# Patient Record
Sex: Male | Born: 1985 | Race: Asian | Hispanic: No | Marital: Married | Smoking: Never smoker
Health system: Southern US, Community
[De-identification: ages and names within clinical notes are randomized; demographics above are authoritative.]

---

## 2010-05-07 ENCOUNTER — Emergency Department (HOSPITAL_COMMUNITY): Admission: EM | Admit: 2010-05-07 | Discharge: 2010-05-07 | Payer: Self-pay | Admitting: Emergency Medicine

## 2011-04-13 ENCOUNTER — Inpatient Hospital Stay (INDEPENDENT_AMBULATORY_CARE_PROVIDER_SITE_OTHER)
Admission: RE | Admit: 2011-04-13 | Discharge: 2011-04-13 | Disposition: A | Discharge status: Home / Self Care | Payer: Self-pay | Source: Ambulatory Visit | Attending: Emergency Medicine | Admitting: Emergency Medicine

## 2011-04-13 DIAGNOSIS — R197 Diarrhea, unspecified: Secondary | ICD-10-CM

## 2011-04-13 DIAGNOSIS — N289 Disorder of kidney and ureter, unspecified: Secondary | ICD-10-CM

## 2011-04-13 LAB — CBC
HCT: 44.9 % (ref 39.0–52.0)
MCHC: 36.1 g/dL — ABNORMAL HIGH (ref 30.0–36.0)
MCV: 82.7 fL (ref 78.0–100.0)
Platelets: 211 10*3/uL (ref 150–400)
RDW: 12.4 % (ref 11.5–15.5)

## 2011-04-13 LAB — POCT I-STAT, CHEM 8
Calcium, Ion: 1.19 mmol/L (ref 1.12–1.32)
Chloride: 106 mEq/L (ref 96–112)
Glucose, Bld: 86 mg/dL (ref 70–99)
HCT: 50 % (ref 39.0–52.0)
TCO2: 25 mmol/L (ref 0–100)

## 2011-04-13 LAB — DIFFERENTIAL
Basophils Absolute: 0 10*3/uL (ref 0.0–0.1)
Eosinophils Absolute: 0.1 10*3/uL (ref 0.0–0.7)
Eosinophils Relative: 2 % (ref 0–5)
Lymphocytes Relative: 31 % (ref 12–46)
Monocytes Absolute: 0.5 10*3/uL (ref 0.1–1.0)

## 2011-04-13 LAB — OCCULT BLOOD, POC DEVICE: Fecal Occult Bld: NEGATIVE

## 2011-04-20 ENCOUNTER — Inpatient Hospital Stay (INDEPENDENT_AMBULATORY_CARE_PROVIDER_SITE_OTHER)
Admission: RE | Admit: 2011-04-20 | Discharge: 2011-04-20 | Disposition: A | Discharge status: Home / Self Care | Payer: Self-pay | Source: Ambulatory Visit | Attending: Family Medicine | Admitting: Family Medicine

## 2011-04-20 DIAGNOSIS — K5289 Other specified noninfective gastroenteritis and colitis: Secondary | ICD-10-CM

## 2011-04-20 LAB — POCT I-STAT, CHEM 8
Calcium, Ion: 1.16 mmol/L (ref 1.12–1.32)
Glucose, Bld: 93 mg/dL (ref 70–99)
HCT: 49 % (ref 39.0–52.0)
Hemoglobin: 16.7 g/dL (ref 13.0–17.0)
Potassium: 4.1 mEq/L (ref 3.5–5.1)

## 2013-01-17 ENCOUNTER — Emergency Department (HOSPITAL_COMMUNITY)
Admission: EM | Admit: 2013-01-17 | Discharge: 2013-01-17 | Disposition: A | Discharge status: Home / Self Care | Payer: BC Managed Care – PPO | Source: Home / Self Care | Attending: Emergency Medicine | Admitting: Emergency Medicine

## 2013-01-17 ENCOUNTER — Encounter (HOSPITAL_COMMUNITY): Payer: Self-pay | Admitting: Emergency Medicine

## 2013-01-17 DIAGNOSIS — R21 Rash and other nonspecific skin eruption: Secondary | ICD-10-CM

## 2013-01-17 MED ORDER — PREDNISONE 20 MG PO TABS: 40.0000 mg | ORAL_TABLET | Freq: Every day | ORAL | Status: AC

## 2013-01-17 NOTE — ED Notes (Signed)
Pt c/o poss allergic reaction to foods Has noticed that every time he eats beef, shrimp, fish he starts to develop a rash on arms and legs Last week he ate beef and developed a rash that he still has Denies: f/v/d; has not had any meds for discomfort  He is alert and oriented w/no signs of acute distress.

## 2013-01-17 NOTE — ED Provider Notes (Signed)
History     CSN: [id number]  Arrival date & time 01/17/13  1522   First MD Initiated Contact with Patient 01/17/13 1619      Chief Complaint  Patient presents with  . Allergic Reaction    (Consider location/radiation/quality/duration/timing/severity/associated sxs/prior treatment) HPI Comments: Patient presents urgent care this evening describing that for approximately 3 years he's been developing a rash to his lower extremities that he attributes is trigger and caused by eating certain foods such as beef and shrimp. Mainly affects his lower extremities are sometimes also on his arms. It mildly itchy his. Denies any constitutional symptoms such as fevers, generalized malaise, providers while he is a changes in appetite or fevers. He has not tried anything over-the-counter he or so far as he didn't know what to take. He is to be prescribed a medicine by an [place] doctor before coming here.  Patient is a 27 y.o. male presenting with allergic reaction. The history is provided by the patient.  Allergic Reaction The primary symptoms are  rash. The primary symptoms do not include wheezing, shortness of breath, cough, dizziness, palpitations, angioedema or urticaria. Episode onset: 3 years ago [place]? This is a new problem.  Significant symptoms that are not present include eye redness or rhinorrhea.    History reviewed. No pertinent past medical history.  History reviewed. No pertinent past surgical history.  No family history on file.  History  Substance Use Topics  . Smoking status: Never Smoker   . Smokeless tobacco: Not on file  . Alcohol Use: No      Review of Systems  Constitutional: Negative for fever, chills, diaphoresis, activity change, appetite change, fatigue and unexpected weight change.  HENT: Negative for rhinorrhea.   Eyes: Negative for redness.  Respiratory: Negative for cough, shortness of breath and wheezing.   Cardiovascular: Negative for palpitations.    Skin: Positive for color change and rash. Negative for pallor and wound.  Neurological: Negative for dizziness.    Allergies  Review of patient's allergies indicates not on file.  Home Medications   Current Outpatient Rx  Name  Route  Sig  Dispense  Refill  . predniSONE (DELTASONE) 20 MG tablet   Oral   Take 2 tablets (40 mg total) by mouth daily. 2 tablets daily for 5 days   10 tablet   0     BP 110/75  Pulse 75  Temp(Src) 97.8 F (36.6 C) (Oral)  Resp 16  SpO2 100%  Physical Exam  Nursing note and vitals reviewed. Constitutional: He appears well-developed and well-nourished.  Musculoskeletal: He exhibits no edema and no tenderness.  Neurological: He is alert.  Skin: Rash noted. There is erythema.       ED Course  Procedures (including critical care time)  Labs Reviewed - No data to display No results found.   1. Rash       MDM  Multiforme- rash with several patches are different stages. Some with hyperpigmented character he was with mild erythema in different sizes mainly plaques and macular. Rash seems to be confined to both lower extremities on his torso or hands were visible.  Differential diagnosis include several conditions and have recommended that he sees a dermatologist for further evaluation and perhaps the skin biopsy. Was given a prescription of prednisone for 5 days to try and see if the rash improves while taking the prednisone.      Rosana Hoes, MD 01/17/13 1734

## 2013-04-24 ENCOUNTER — Emergency Department (HOSPITAL_COMMUNITY): Payer: BC Managed Care – PPO

## 2013-04-24 ENCOUNTER — Encounter (HOSPITAL_COMMUNITY): Payer: Self-pay

## 2013-04-24 ENCOUNTER — Emergency Department (HOSPITAL_COMMUNITY)
Admission: EM | Admit: 2013-04-24 | Discharge: 2013-04-25 | Disposition: A | Discharge status: Home / Self Care | Payer: BC Managed Care – PPO | Attending: Emergency Medicine | Admitting: Emergency Medicine

## 2013-04-24 DIAGNOSIS — J36 Peritonsillar abscess: Secondary | ICD-10-CM | POA: Insufficient documentation

## 2013-04-24 DIAGNOSIS — R259 Unspecified abnormal involuntary movements: Secondary | ICD-10-CM | POA: Insufficient documentation

## 2013-04-24 MED ORDER — CLINDAMYCIN PHOSPHATE 600 MG/50ML IV SOLN
600.0000 mg | Freq: Once | INTRAVENOUS | Status: AC
  Administered 2013-04-25: 600 mg via INTRAVENOUS
  Filled 2013-04-24: qty 50

## 2013-04-24 MED ORDER — DEXAMETHASONE SODIUM PHOSPHATE 10 MG/ML IJ SOLN
10.0000 mg | Freq: Once | INTRAMUSCULAR | Status: AC
  Administered 2013-04-25: 10 mg via INTRAVENOUS
  Filled 2013-04-24: qty 1

## 2013-04-24 MED ORDER — SODIUM CHLORIDE 0.9 % IV BOLUS (SEPSIS)
1000.0000 mL | Freq: Once | INTRAVENOUS | Status: AC
  Administered 2013-04-25: 1000 mL via INTRAVENOUS

## 2013-04-24 MED ORDER — IOHEXOL 300 MG/ML  SOLN
80.0000 mL | Freq: Once | INTRAMUSCULAR | Status: AC | PRN
  Administered 2013-04-24: 80 mL via INTRAVENOUS

## 2013-04-24 MED ORDER — KETOROLAC TROMETHAMINE 60 MG/2ML IM SOLN: 60.0000 mg | Freq: Once | INTRAMUSCULAR | Status: DC

## 2013-04-24 MED ORDER — FENTANYL CITRATE 0.05 MG/ML IJ SOLN
50.0000 ug | Freq: Once | INTRAMUSCULAR | Status: AC
  Administered 2013-04-24: 50 ug via INTRAVENOUS
  Filled 2013-04-24: qty 2

## 2013-04-24 MED ORDER — HYDROMORPHONE HCL PF 1 MG/ML IJ SOLN: 1.0000 mg | Freq: Once | INTRAMUSCULAR | Status: DC

## 2013-04-24 MED ORDER — MORPHINE SULFATE 4 MG/ML IJ SOLN
8.0000 mg | Freq: Once | INTRAMUSCULAR | Status: AC
  Administered 2013-04-25: 8 mg via INTRAVENOUS
  Filled 2013-04-24: qty 2

## 2013-04-24 NOTE — ED Notes (Signed)
Pt reports having his Right lower molar extracted Friday. Pt has been having pain and swelling to area since having tooth extracted. Pt prescribed Hydrocodone w/no relief

## 2013-04-24 NOTE — ED Provider Notes (Signed)
CSN: [id number]     Arrival date & time 04/24/13  2113 History  This chart was scribed for Jaynie Crumble, PA-C, working with Lyanne Co, MD, by [facility] ED Scribe. This patient was seen in room TR05C/TR05C and the patient's care was started at 10:35 PM.   First MD Initiated Contact with Patient 04/24/13 2222     Chief Complaint  Patient presents with  . Dental Pain    The history is provided by the patient. No language interpreter was used.   HPI Comments: Drew Stanton is a 27 y.o. male who presents to the Emergency Department complaining of constant, moderate dental pain over the past 3 days. He states that he had a dental procedure performed 3 days ago, and he has had gradually worsening pain and swelling to the area since. Pt has been prescribed and has been taking Vicodin once a day without relief of pain. His pain is greatly worsened with opening his mouth and he is crying. He denies fever, chills, nausea, vomiting, rash, neck pain, ear pain, trouble swallowing or any other symptoms. He denies any history of smoking and denies alcohol use.   History reviewed. No pertinent past medical history.  History reviewed. No pertinent past surgical history.  History reviewed. No pertinent family history. History  Substance Use Topics  . Smoking status: Never Smoker   . Smokeless tobacco: Not on file  . Alcohol Use: No    Review of Systems  Constitutional: Negative for fever and chills.  HENT: Positive for dental problem. Negative for ear pain, trouble swallowing and neck pain.   Gastrointestinal: Negative for nausea and vomiting.  Skin: Negative for rash.  All other systems reviewed and are negative.    Allergies  Review of patient's allergies indicates no known allergies.  Home Medications   Current Outpatient Rx  Name  Route  Sig  Dispense  Refill  . HYDROcodone-acetaminophen (NORCO/VICODIN) 5-325 MG per tablet   Oral   Take 1 tablet by mouth every 8 (eight)  hours as needed for pain.          BP 119/77  Pulse 105  Temp(Src) 99.2 F (37.3 C) (Oral)  Resp 18  SpO2 99%  Physical Exam  Nursing note and vitals reviewed. Constitutional: He is oriented to person, place, and time. He appears well-developed and well-nourished.  HENT:  Head: Normocephalic and atraumatic.  Right Ear: External ear normal.  Left Ear: External ear normal.  Nose: Nose normal.  He is unable to swallow his saliva and he is drooling. He has trismus. He is unable to open his mouth more than a finger's width. He has swelling to the right tonsil uvula is mildly deviated to the left. Unable to examine well due to trismus. There is tenderness over right submandibular area.   Eyes: Conjunctivae and EOM are normal. Pupils are equal, round, and reactive to light.  Neck: Normal range of motion. Neck supple. No tracheal deviation present.  Cardiovascular: Normal rate, regular rhythm and normal heart sounds.   Pulmonary/Chest: Effort normal and breath sounds normal. No respiratory distress.  Abdominal: Soft. Bowel sounds are normal. There is no tenderness.  Musculoskeletal: Normal range of motion. He exhibits no tenderness.  Neurological: He is alert and oriented to person, place, and time.  Skin: Skin is warm and dry. No rash noted.  Psychiatric: He has a normal mood and affect.    ED Course   Medications  HYDROmorphone (DILAUDID) injection 1 mg (not administered)  ketorolac (TORADOL) injection 60 mg (not administered)   Procedures (including critical care time)  DIAGNOSTIC STUDIES: Oxygen Saturation is 99% on RA, normal by my interpretation.    COORDINATION OF CARE: 10:40 PM- Pt's history gathered, but examination of his mouth could not be performed at this time due to pain. Pt advised of plan to receive pain medication, and be revisited once his pain has diminished and pt agrees.  Labs Reviewed - No data to display  Ct Soft Tissue Neck W Contrast  04/24/2013    *RADIOLOGY REPORT*  Clinical Data: Dental pain  CT NECK WITH CONTRAST  Technique:  Multidetector CT imaging of the neck was performed with intravenous contrast.  Contrast: 80mL OMNIPAQUE IOHEXOL 300 MG/ML  SOLN  Comparison: None.  Findings: The partially visualized intracranial contents are normal.  The the patient is slightly rotated in the scanner.  The orbits are normal. Minimal polypoid opacity is present within the right maxillary sinus. Amount of opacity present within the inferior right frontal sinus.  Otherwise, the paranasal sinuses are clear The mastoid air cells well pneumatized.  The salivary glands including the parotid glands and submandibular glands are normal.  Asymmetric swelling and edema within the right parapharyngeal soft tissues.  A somewhat ill-defined low density rim enhancing fluid collection measuring approximately 1.8 x 2.8 cm is seen centered in the right tonsil (series 2, image 55), worrisome for tonsillar abscess.  In addition, on coronal sequences, there is question of inferior extension raising the possibility of a peri tonsillar component. The airway is somewhat displaced to the left but widely patent.  The larynx and hypopharynx are within normal limits.  There is a mildly prominent right level IIa node measuring up to 1 cm in short axis.  Otherwise, no pathologically enlarged lymph nodes are identified within the neck.  The thyroid gland is normal.  The visualized lungs are clear.  No osseous abnormalities are identified.  IMPRESSION:  1.  Low density rim enhancing fluid collection within the right tonsil with question of peritonsillar extension as above, worrisome for tonsillar/peritonsillar abscess.  Correlation with direct visualization is recommended.  2.  Mildly prominent right level IIa lymph node, likely reactive.  Critical Value/emergent results were called by telephone at the time of interpretation on 04/24/2013 at 11:43 p.m. to Dr. Orlean Bradford, who verbally  acknowledged these results.   Original Report Authenticated By: Rise Mu, M.D.    1. Peritonsillar abscess     MDM  Pt with right peritonsillar abscess per CT soft tissue neck. Pt feels better with pain medications. Ordered clindamycin 600mg , decadron 10mg  IV, morphine IV, fluids. Will page ENT.  12:25 AM Discussed with Dr. Emeline Darling. Agreed with current treatments. Will see in the office in the morning.   Filed Vitals:   04/24/13 2127  BP: 119/77  Pulse: 105  Temp: 99.2 F (37.3 C)  TempSrc: Oral  Resp: 18  SpO2: 99%      I personally performed the services described in this documentation, which was scribed in my presence. The recorded information has been reviewed and is accurate.    Lottie Mussel, PA-C 04/25/13 [phone]

## 2013-04-25 MED ORDER — OXYCODONE-ACETAMINOPHEN 5-325 MG PO TABS: 1.0000 | ORAL_TABLET | ORAL | Status: DC | PRN

## 2013-04-25 MED ORDER — CLINDAMYCIN HCL 300 MG PO CAPS: 300.0000 mg | ORAL_CAPSULE | Freq: Four times a day (QID) | ORAL | Status: DC

## 2013-04-25 NOTE — ED Provider Notes (Signed)
Medical screening examination/treatment/procedure(s) were conducted as a shared visit with non-physician practitioner(s) and myself.  I personally evaluated the patient during the encounter  Likely developing right-sided peritonsillar abscess.  On examination his uvula is still midline at this time but he does have fullness of his right tonsillar pillar.  Decadron, antibiotics, pain medicine given.  Case was discussed with on-call ENT who will see the patient in the office tomorrow morning  Lyanne Co, MD 04/25/13 [phone]

## 2013-08-28 ENCOUNTER — Encounter (HOSPITAL_COMMUNITY): Payer: Self-pay | Admitting: Emergency Medicine

## 2013-08-28 DIAGNOSIS — R748 Abnormal levels of other serum enzymes: Secondary | ICD-10-CM | POA: Insufficient documentation

## 2013-08-28 DIAGNOSIS — E876 Hypokalemia: Secondary | ICD-10-CM | POA: Insufficient documentation

## 2013-08-28 DIAGNOSIS — R000 Tachycardia, unspecified: Secondary | ICD-10-CM | POA: Insufficient documentation

## 2013-08-28 DIAGNOSIS — D72829 Elevated white blood cell count, unspecified: Secondary | ICD-10-CM | POA: Insufficient documentation

## 2013-08-28 DIAGNOSIS — R109 Unspecified abdominal pain: Secondary | ICD-10-CM | POA: Insufficient documentation

## 2013-08-28 DIAGNOSIS — IMO0001 Reserved for inherently not codable concepts without codable children: Secondary | ICD-10-CM | POA: Insufficient documentation

## 2013-08-28 DIAGNOSIS — R197 Diarrhea, unspecified: Secondary | ICD-10-CM | POA: Insufficient documentation

## 2013-08-28 LAB — CBC WITH DIFFERENTIAL/PLATELET
Basophils Absolute: 0 10*3/uL (ref 0.0–0.1)
Basophils Relative: 0 % (ref 0–1)
MCHC: 35.5 g/dL (ref 30.0–36.0)
Neutro Abs: 9.8 10*3/uL — ABNORMAL HIGH (ref 1.7–7.7)
Neutrophils Relative %: 86 % — ABNORMAL HIGH (ref 43–77)
Platelets: 180 10*3/uL (ref 150–400)
RDW: 12.1 % (ref 11.5–15.5)
WBC: 11.4 10*3/uL — ABNORMAL HIGH (ref 4.0–10.5)

## 2013-08-28 LAB — COMPREHENSIVE METABOLIC PANEL
ALT: 67 U/L — ABNORMAL HIGH (ref 0–53)
AST: 38 U/L — ABNORMAL HIGH (ref 0–37)
Albumin: 3.7 g/dL (ref 3.5–5.2)
Alkaline Phosphatase: 67 U/L (ref 39–117)
Chloride: 98 mEq/L (ref 96–112)
Creatinine, Ser: 1.4 mg/dL — ABNORMAL HIGH (ref 0.50–1.35)
Potassium: 3.2 mEq/L — ABNORMAL LOW (ref 3.5–5.1)
Sodium: 131 mEq/L — ABNORMAL LOW (ref 135–145)
Total Bilirubin: 0.6 mg/dL (ref 0.3–1.2)

## 2013-08-28 NOTE — ED Notes (Signed)
Pt. reports diarrhea with generalized body aches and malaise onset yesterday .

## 2013-08-29 ENCOUNTER — Emergency Department (HOSPITAL_COMMUNITY)
Admission: EM | Admit: 2013-08-29 | Discharge: 2013-08-29 | Disposition: A | Discharge status: Home / Self Care | Payer: BC Managed Care – PPO | Attending: Emergency Medicine | Admitting: Emergency Medicine

## 2013-08-29 DIAGNOSIS — R748 Abnormal levels of other serum enzymes: Secondary | ICD-10-CM

## 2013-08-29 DIAGNOSIS — R197 Diarrhea, unspecified: Secondary | ICD-10-CM

## 2013-08-29 MED ORDER — IBUPROFEN 800 MG PO TABS: 800.0000 mg | ORAL_TABLET | Freq: Three times a day (TID) | ORAL | Status: DC

## 2013-08-29 MED ORDER — LOPERAMIDE HCL 2 MG PO CAPS: 2.0000 mg | ORAL_CAPSULE | Freq: Four times a day (QID) | ORAL | Status: AC | PRN

## 2013-08-29 MED ORDER — KETOROLAC TROMETHAMINE 30 MG/ML IJ SOLN
30.0000 mg | Freq: Once | INTRAMUSCULAR | Status: AC
  Administered 2013-08-29: 30 mg via INTRAVENOUS
  Filled 2013-08-29: qty 1

## 2013-08-29 MED ORDER — POTASSIUM CHLORIDE CRYS ER 20 MEQ PO TBCR
40.0000 meq | EXTENDED_RELEASE_TABLET | Freq: Once | ORAL | Status: AC
  Administered 2013-08-29: 40 meq via ORAL
  Filled 2013-08-29: qty 2

## 2013-08-29 MED ORDER — ONDANSETRON HCL 4 MG/2ML IJ SOLN
4.0000 mg | Freq: Once | INTRAMUSCULAR | Status: AC
  Administered 2013-08-29: 4 mg via INTRAVENOUS
  Filled 2013-08-29: qty 2

## 2013-08-29 MED ORDER — SODIUM CHLORIDE 0.9 % IV BOLUS (SEPSIS)
1000.0000 mL | Freq: Once | INTRAVENOUS | Status: AC
  Administered 2013-08-29: 1000 mL via INTRAVENOUS

## 2013-08-29 MED ORDER — ACETAMINOPHEN 325 MG PO TABS
650.0000 mg | ORAL_TABLET | Freq: Once | ORAL | Status: AC
Start: 2013-08-29 — End: 2013-08-29
  Administered 2013-08-29: 650 mg via ORAL
  Filled 2013-08-29: qty 2

## 2013-08-29 MED ORDER — MORPHINE SULFATE 4 MG/ML IJ SOLN
4.0000 mg | Freq: Once | INTRAMUSCULAR | Status: AC
  Administered 2013-08-29: 4 mg via INTRAVENOUS
  Filled 2013-08-29: qty 1

## 2013-08-29 NOTE — ED Notes (Signed)
Nurse First rounds : Nurse explained delay , wait time and process to pt. Denies pain at this time / respirations unlabored.

## 2013-08-29 NOTE — ED Provider Notes (Signed)
CSN: [id number]     Arrival date & time 08/28/13  2142 History   First MD Initiated Contact with Patient 08/29/13 0208     Chief Complaint  Patient presents with  . Diarrhea  . Generalized Body Aches   (Consider location/radiation/quality/duration/timing/severity/associated sxs/prior Treatment) HPI History provided by patient through translator/family bedside.  Diarrhea for the last 24 hours, multiple episodes with some intermittent abdominal cramping. No pain at this time. No fevers or chills. No nausea or vomiting. No known sick contacts. No medications. No recent travel. Symptoms moderate in severity. No black or tarry stools. No watery stools. No blood in stools. No recent antibiotics. He also complains of some body aches tonight, no muscle cramps  History reviewed. No pertinent past medical history. History reviewed. No pertinent past surgical history. No family history on file. History  Substance Use Topics  . Smoking status: Never Smoker   . Smokeless tobacco: Not on file  . Alcohol Use: No    Review of Systems  Constitutional: Negative for fever and chills.  Eyes: Negative for visual disturbance.  Respiratory: Negative for shortness of breath.   Cardiovascular: Negative for chest pain.  Gastrointestinal: Positive for diarrhea. Negative for abdominal pain and blood in stool.  Genitourinary: Negative for dysuria and hematuria.  Musculoskeletal: Negative for back pain, neck pain and neck stiffness.  Skin: Negative for rash.  Neurological: Negative for headaches.  All other systems reviewed and are negative.    Allergies  Review of patient's allergies indicates no known allergies.  Home Medications  No current outpatient prescriptions on file. BP 116/67  Pulse 94  Temp(Src) 100.6 F (38.1 C) (Oral)  Resp 18  Ht 5\' 3"  (1.6 m)  Wt 139 lb (63.05 kg)  BMI 24.63 kg/m2  SpO2 98% Physical Exam  Constitutional: He is oriented to person, place, and time. He appears  well-developed and well-nourished.  HENT:  Head: Normocephalic and atraumatic.  Mildly dry mucous membranes  Eyes: EOM are normal. Pupils are equal, round, and reactive to light. No scleral icterus.  Neck: Neck supple.  Cardiovascular: Regular rhythm and intact distal pulses.   Borderline tachycardic heart rate 90s to 104  Pulmonary/Chest: Effort normal and breath sounds normal. No respiratory distress.  Abdominal: Soft. Bowel sounds are normal. He exhibits no distension. There is no tenderness. There is no rebound and no guarding.  No CVA tenderness  Musculoskeletal: Normal range of motion. He exhibits no edema.  Neurological: He is alert and oriented to person, place, and time.  Skin: Skin is warm and dry.    ED Course  Procedures (including critical care time) Labs Review Labs Reviewed  CBC WITH DIFFERENTIAL - Abnormal; Notable for the following:    WBC 11.4 (*)    Neutrophils Relative % 86 (*)    Neutro Abs 9.8 (*)    Lymphocytes Relative 9 (*)    All other components within normal limits  COMPREHENSIVE METABOLIC PANEL - Abnormal; Notable for the following:    Sodium 131 (*)    Potassium 3.2 (*)    Glucose, Bld 132 (*)    Creatinine, Ser 1.40 (*)    AST 38 (*)    ALT 67 (*)    GFR calc non Af Amer 69 (*)    GFR calc Af Amer 80 (*)    All other components within normal limits   IV fluids provided. IV morphine, Toradol, Zofran  Potassium provided for mild hypokalemia.  Repeat abdominal exam remains soft nontender nondistended. Is  making urine and tolerating by mouth fluids. Plan discharge home with prescription for Imodium and strict return precautions verbalizes understood.  MDM  Diagnosis: Diarrhea  Evaluated with labs obtained and reviewed as above, mildly elevated creatinine, low potassium. Mild leukocytosis but is afebrile and symptomatically improving with medications provided. Vital signs and nursing notes reviewed and considered, tachycardia improved.  Sunnie Nielsen, MD 08/29/13 [phone]

## 2015-06-17 ENCOUNTER — Emergency Department (HOSPITAL_COMMUNITY)
Admission: EM | Admit: 2015-06-17 | Discharge: 2015-06-17 | Disposition: A | Discharge status: Home / Self Care | Payer: BLUE CROSS/BLUE SHIELD | Source: Home / Self Care | Attending: Family Medicine | Admitting: Family Medicine

## 2015-06-17 ENCOUNTER — Encounter (HOSPITAL_COMMUNITY): Payer: Self-pay | Admitting: *Deleted

## 2015-06-17 DIAGNOSIS — H8110 Benign paroxysmal vertigo, unspecified ear: Secondary | ICD-10-CM

## 2015-06-17 DIAGNOSIS — S39012A Strain of muscle, fascia and tendon of lower back, initial encounter: Secondary | ICD-10-CM | POA: Diagnosis not present

## 2015-06-17 MED ORDER — IPRATROPIUM BROMIDE 0.06 % NA SOLN: 2.0000 | Freq: Four times a day (QID) | NASAL | Status: AC

## 2015-06-17 MED ORDER — CYCLOBENZAPRINE HCL 5 MG PO TABS: 5.0000 mg | ORAL_TABLET | Freq: Three times a day (TID) | ORAL | Status: DC | PRN

## 2015-06-17 MED ORDER — MECLIZINE HCL 50 MG PO TABS: 50.0000 mg | ORAL_TABLET | Freq: Three times a day (TID) | ORAL | Status: AC | PRN

## 2015-06-17 NOTE — ED Notes (Signed)
Pt  Reports  Symptoms  Of  dizzyness    And  Lightheaded     For   About   1  Week         Pt  Reports             He  Has   Been               Working  A  Lot  Recently

## 2015-06-17 NOTE — ED Provider Notes (Signed)
CSN: [id number]     Arrival date & time 06/17/15  1552 History   First MD Initiated Contact with Patient 06/17/15 1739     Chief Complaint  Patient presents with  . Dizziness   (Consider location/radiation/quality/duration/timing/severity/associated sxs/prior Treatment) Patient is a 29 y.o. male presenting with dizziness. The history is provided by the patient and the spouse.  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   History reviewed. No pertinent past medical history. History reviewed. No pertinent past surgical history. History reviewed. No pertinent family history. Social History  Substance Use Topics  . Smoking status: Never Smoker   . Smokeless tobacco: None  . Alcohol Use: No    Review of Systems  Constitutional: Negative.   HENT: Negative.  Negative for ear discharge, ear pain and tinnitus.   Respiratory: Negative for shortness of breath.   Cardiovascular: Negative.  Negative for palpitations.  Musculoskeletal: Positive for back pain. Negative for gait problem.  Neurological: Positive for dizziness. Negative for headaches.  All other systems reviewed and are negative.   Allergies  Review of patient's allergies indicates no known allergies.  Home Medications   Prior to Admission medications   Medication Sig Start Date End Date Taking? Authorizing Provider  cyclobenzaprine (FLEXERIL) 5 MG tablet Take 1 tablet (5 mg total) by mouth 3 (three) times daily as needed for muscle spasms. 06/17/15   Linna Hoff, MD  ibuprofen (ADVIL,MOTRIN) 800 MG tablet Take 1 tablet (800 mg total) by mouth  3 (three) times daily. 08/29/13   Sunnie Nielsen, MD  ipratropium (ATROVENT) 0.06 % nasal spray Place 2 sprays into the nose 4 (four) times daily. 06/17/15   Linna Hoff, MD  loperamide (IMODIUM) 2 MG capsule Take 1 capsule (2 mg total) by mouth 4 (four) times daily as needed for diarrhea or loose stools. 08/29/13   Sunnie Nielsen, MD  meclizine (ANTIVERT) 50 MG tablet Take 1 tablet (50 mg total) by mouth 3 (three) times daily as needed. For dizziness 06/17/15   Linna Hoff, MD   Meds Ordered and Administered this Visit  Medications - No data to display  BP 124/78 mmHg  Pulse 78  Temp(Src) 98.6 F (37 C) (Oral)  Resp 16  SpO2 100% No data found.   Physical Exam  Constitutional: He is oriented to person, place, and time. He appears well-developed and well-nourished. No distress.  HENT:  Head: Normocephalic.  Right Ear: External ear normal.  Left Ear: External ear normal.  Mouth/Throat: Oropharynx is clear and moist.  Eyes: Conjunctivae and EOM are normal. Pupils are equal, round, and reactive to light.  Neck: Normal range of motion. Neck supple.  Cardiovascular: Normal heart sounds.   Pulmonary/Chest: Breath sounds normal.  Musculoskeletal: Normal range of motion.  Lymphadenopathy:    He has no cervical adenopathy.  Neurological: He is alert and oriented to person, place, and time.  Skin: Skin is warm and dry.  Nursing note and vitals reviewed.   ED Course  Procedures (including critical care time)  Labs Review Labs Reviewed - No data to display  Imaging Review No results found.  Visual Acuity Review  Right Eye Distance:   Left Eye Distance:   Bilateral Distance:    Right Eye Near:   Left Eye Near:    Bilateral Near:         MDM   1. Low back strain, initial encounter   2. Vertigo, benign paroxysmal, unspecified laterality       Linna Hoff, MD 06/17/15 [phone]

## 2016-03-24 ENCOUNTER — Encounter (HOSPITAL_COMMUNITY): Payer: Self-pay | Admitting: Emergency Medicine

## 2016-03-24 ENCOUNTER — Ambulatory Visit (HOSPITAL_COMMUNITY)
Admission: EM | Admit: 2016-03-24 | Discharge: 2016-03-24 | Disposition: A | Discharge status: Home / Self Care | Payer: BLUE CROSS/BLUE SHIELD | Attending: Family Medicine | Admitting: Family Medicine

## 2016-03-24 DIAGNOSIS — K219 Gastro-esophageal reflux disease without esophagitis: Secondary | ICD-10-CM | POA: Diagnosis not present

## 2016-03-24 MED ORDER — GI COCKTAIL ~~LOC~~
30.0000 mL | Freq: Once | ORAL | Status: AC
  Administered 2016-03-24: 30 mL via ORAL

## 2016-03-24 MED ORDER — RANITIDINE HCL 150 MG PO TABS: 150.0000 mg | ORAL_TABLET | Freq: Two times a day (BID) | ORAL | Status: AC

## 2016-03-24 MED ORDER — GI COCKTAIL ~~LOC~~
ORAL | Status: AC
  Filled 2016-03-24: qty 30

## 2016-03-24 NOTE — ED Provider Notes (Signed)
CSN: [id number]     Arrival date & time 03/24/16  1105 History   First MD Initiated Contact with Patient 03/24/16 1127     Chief Complaint  Patient presents with  . Abdominal Pain   (Consider location/radiation/quality/duration/timing/severity/associated sxs/prior Treatment) Patient is a 30 y.o. male presenting with abdominal pain.  Abdominal Pain Pain location:  Epigastric Pain quality: burning   Pain radiates to:  Back Pain severity:  Mild Onset quality:  Gradual Duration:  12 weeks Progression:  Worsening Chronicity:  Recurrent (similar in [place], resolved.) Context: not alcohol use   Relieved by:  None tried Worsened by:  Nothing tried Ineffective treatments:  None tried Associated symptoms: nausea   Associated symptoms: no constipation, no diarrhea, no fever, no melena and no vomiting   Associated symptoms comment:  Sx in am on awakening. Risk factors: NSAID use     History reviewed. No pertinent past medical history. History reviewed. No pertinent past surgical history. No family history on file. Social History  Substance Use Topics  . Smoking status: Never Smoker   . Smokeless tobacco: None  . Alcohol Use: No    Review of Systems  Constitutional: Negative for fever.  Gastrointestinal: Positive for nausea and abdominal pain. Negative for vomiting, diarrhea, constipation and melena.  All other systems reviewed and are negative.   Allergies  Review of patient's allergies indicates no known allergies.  Home Medications   Prior to Admission medications   Medication Sig Start Date End Date Taking? Authorizing Provider  cyclobenzaprine (FLEXERIL) 5 MG tablet Take 1 tablet (5 mg total) by mouth 3 (three) times daily as needed for muscle spasms. 06/17/15   Linna HoffJames D Semaj Kham, MD  ibuprofen (ADVIL,MOTRIN) 800 MG tablet Take 1 tablet (800 mg total) by mouth 3 (three) times daily. 08/29/13   Sunnie NielsenBrian Opitz, MD  ipratropium (ATROVENT) 0.06 % nasal spray Place 2 sprays into the  nose 4 (four) times daily. 06/17/15   Linna HoffJames D Ryane Konieczny, MD  loperamide (IMODIUM) 2 MG capsule Take 1 capsule (2 mg total) by mouth 4 (four) times daily as needed for diarrhea or loose stools. 08/29/13   Sunnie NielsenBrian Opitz, MD  meclizine (ANTIVERT) 50 MG tablet Take 1 tablet (50 mg total) by mouth 3 (three) times daily as needed. For dizziness 06/17/15   Linna HoffJames D Marceline Napierala, MD  ranitidine (ZANTAC) 150 MG tablet Take 1 tablet (150 mg total) by mouth 2 (two) times daily. 03/24/16   Linna HoffJames D Kidus Delman, MD   Meds Ordered and Administered this Visit   Medications  gi cocktail (Maalox,Lidocaine,Donnatal) (30 mLs Oral Given 03/24/16 1139)    BP 112/73 mmHg  Pulse 63  Temp(Src) 97.9 F (36.6 C) (Oral)  Resp 16  SpO2 99% No data found.   Physical Exam  Constitutional: He is oriented to person, place, and time. He appears well-developed and well-nourished.  Neck: Normal range of motion.  Pulmonary/Chest: Effort normal and breath sounds normal.  Abdominal: Soft. Bowel sounds are normal. He exhibits no distension and no mass. There is no rebound and no guarding.  Lymphadenopathy:    He has no cervical adenopathy.  Neurological: He is alert and oriented to person, place, and time.  Skin: Skin is dry.  Nursing note and vitals reviewed.   ED Course  Procedures (including critical care time)  Labs Review Labs Reviewed - No data to display  Imaging Review No results found.   Visual Acuity Review  Right Eye Distance:   Left Eye Distance:   Bilateral Distance:  Right Eye Near:   Left Eye Near:    Bilateral Near:         MDM   1. Gastroesophageal reflux disease without esophagitis        Linna HoffJames D Jennafer Gladue, MD 03/24/16 1651

## 2016-03-24 NOTE — ED Notes (Signed)
Patient reports abdominal pain and burning for years.  Has seen physicians before and told he has acid reflux issues.

## 2017-04-27 ENCOUNTER — Other Ambulatory Visit: Payer: Self-pay | Admitting: Gastroenterology

## 2017-04-27 DIAGNOSIS — R110 Nausea: Secondary | ICD-10-CM

## 2017-05-03 ENCOUNTER — Ambulatory Visit
Admission: RE | Admit: 2017-05-03 | Discharge: 2017-05-03 | Disposition: A | Discharge status: Home / Self Care | Payer: BLUE CROSS/BLUE SHIELD | Source: Ambulatory Visit | Attending: Gastroenterology | Admitting: Gastroenterology

## 2017-05-03 DIAGNOSIS — R110 Nausea: Secondary | ICD-10-CM

## 2017-05-22 ENCOUNTER — Encounter (HOSPITAL_COMMUNITY): Payer: Self-pay | Admitting: *Deleted

## 2017-05-22 ENCOUNTER — Emergency Department (HOSPITAL_COMMUNITY)
Admission: EM | Admit: 2017-05-22 | Discharge: 2017-05-22 | Disposition: A | Discharge status: Home / Self Care | Payer: BLUE CROSS/BLUE SHIELD | Attending: Emergency Medicine | Admitting: Emergency Medicine

## 2017-05-22 DIAGNOSIS — M436 Torticollis: Secondary | ICD-10-CM

## 2017-05-22 DIAGNOSIS — Z79899 Other long term (current) drug therapy: Secondary | ICD-10-CM | POA: Diagnosis not present

## 2017-05-22 DIAGNOSIS — M542 Cervicalgia: Secondary | ICD-10-CM | POA: Diagnosis present

## 2017-05-22 DIAGNOSIS — Z791 Long term (current) use of non-steroidal anti-inflammatories (NSAID): Secondary | ICD-10-CM | POA: Insufficient documentation

## 2017-05-22 MED ORDER — IBUPROFEN 800 MG PO TABS: 800.0000 mg | ORAL_TABLET | Freq: Three times a day (TID) | ORAL | 0 refills | Status: AC | PRN

## 2017-05-22 MED ORDER — IBUPROFEN 400 MG PO TABS
800.0000 mg | ORAL_TABLET | Freq: Once | ORAL | Status: AC
  Administered 2017-05-22: 800 mg via ORAL
  Filled 2017-05-22: qty 2

## 2017-05-22 MED ORDER — CYCLOBENZAPRINE HCL 10 MG PO TABS
5.0000 mg | ORAL_TABLET | Freq: Once | ORAL | Status: AC
  Administered 2017-05-22: 5 mg via ORAL
  Filled 2017-05-22: qty 1

## 2017-05-22 MED ORDER — CYCLOBENZAPRINE HCL 5 MG PO TABS: 5.0000 mg | ORAL_TABLET | Freq: Three times a day (TID) | ORAL | 0 refills | Status: AC | PRN

## 2017-05-22 NOTE — ED Provider Notes (Signed)
[facility]-EMERGENCY DEPT Provider Note   CSN: [id number] Arrival date & time: 05/22/17  0400     History   Chief Complaint Chief Complaint  Patient presents with  . Torticollis    HPI Drew Stanton is a 31 y.o. male.  HPI   31 year old Male presenting for evaluation of neck pain. Pt speak Clydie BraunKaren, language intepreter was used.  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 Yesterday during work as he was twisting his neck to pick up an object he developed pain to the right side of his neck. Pain has been a constant sensation and described as a tightness and stiffness sharp pain radiates to the right side of his back. Pain worsening when he moves his head. Pain is of moderate in severity, no improvement while taking Tylenol. No associated fever, chills, severe headache, URI symptoms, chest pain, numbness down his arms, or rash. Denies any recent injury.  History reviewed. No pertinent past medical history.  There are no active problems to display for this patient.   History reviewed. No pertinent surgical history.     Home Medications    Prior to Admission medications   Medication Sig Start Date End Date Taking? Authorizing Provider  cyclobenzaprine (FLEXERIL) 5 MG tablet Take 1 tablet (5 mg total) by mouth 3 (three) times daily as needed for muscle spasms. 06/17/15   Linna HoffKindl, James D, MD  ibuprofen (ADVIL,MOTRIN) 800 MG tablet Take 1 tablet (800 mg total) by mouth 3 (three) times daily. 08/29/13   Sunnie Nielsenpitz, Brian, MD  ipratropium (ATROVENT) 0.06 % nasal spray Place 2 sprays into the nose 4 (four) times daily. 06/17/15   Linna HoffKindl, James D, MD  loperamide (IMODIUM) 2 MG capsule Take 1 capsule (2 mg total) by mouth 4 (four) times daily as needed for diarrhea or loose stools. 08/29/13   Sunnie Nielsenpitz, Brian, MD  meclizine (ANTIVERT) 50 MG tablet Take 1 tablet (50 mg total) by mouth 3 (three) times daily as needed. For dizziness 06/17/15   Linna HoffKindl, James D, MD  ranitidine (ZANTAC) 150 MG  tablet Take 1 tablet (150 mg total) by mouth 2 (two) times daily. 03/24/16   Linna HoffKindl, James D, MD    Family History No family history on file.  Social History Social History  Substance Use Topics  . Smoking status: Never Smoker  . Smokeless tobacco: Never Used  . Alcohol use No     Allergies   Patient has no known allergies.   Review of Systems Review of Systems  Constitutional: Negative for fever.  Musculoskeletal: Positive for neck pain and neck stiffness.  Neurological: Negative for headaches.     Physical Exam Updated Vital Signs BP 122/84 (BP Location: Left Arm)   Pulse (!) 56   Temp 98.5 F (36.9 C) (Oral)   Resp 18   Ht 5\' 4"  (1.626 m)   Wt 63 kg (139 lb)   SpO2 95%   BMI 23.86 kg/m   Physical Exam  Constitutional: He appears well-developed and well-nourished. No distress.  HENT:  Head: Atraumatic.  Eyes: Conjunctivae are normal.  Neck: Neck supple.  Tenderness to right paracervical spinal muscle and right trapezius on palpation with decreased lateral neck movement to the affected area. No significant midline spine tenderness crepitus step-off. No carotid bruit, no overlying skin changes.  Cardiovascular: Normal rate, regular rhythm and intact distal pulses.   Pulmonary/Chest: Effort normal and breath sounds normal.  Abdominal: Soft. Bowel sounds are normal.  Musculoskeletal:  Right shoulder  with full range of motion. Normal grip strength bilaterally  Neurological: He is alert.  Skin: No rash noted.  Psychiatric: He has a normal mood and affect.  Nursing note and vitals reviewed.    ED Treatments / Results  Labs (all labs ordered are listed, but only abnormal results are displayed) Labs Reviewed - No data to display  EKG  EKG Interpretation None       Radiology No results found.  Procedures Procedures (including critical care time)  Medications Ordered in ED Medications - No data to display   Initial Impression / Assessment and Plan  / ED Course  I have reviewed the triage vital signs and the nursing notes.  Pertinent labs & imaging results that were available during my care of the patient were reviewed by me and considered in my medical decision making (see chart for details).     BP 122/84 (BP Location: Left Arm)   Pulse (!) 56   Temp 98.5 F (36.9 C) (Oral)   Resp 18   Ht 5\' 4"  (1.626 m)   Wt 63 kg (139 lb)   SpO2 95%   BMI 23.86 kg/m    Final Clinical Impressions(s) / ED Diagnoses   Final diagnoses:  Torticollis, acute    New Prescriptions Current Discharge Medication List     5:13 AM Pt here with reproducible R sided neck pain consistent with torticollis. Doubt cervical spine fracture.  Doubt infectious etiology.  Pt is NVI.  Will prescribe NSAIDs and muscle relaxant. Ortho referral given as needed.  Return precaution discussed.    Fayrene Helper, PA-C 05/22/17 0515    Mancel Bale, MD 05/22/17 [phone]

## 2017-05-22 NOTE — ED Notes (Signed)
ED Provider at bedside. 

## 2017-05-22 NOTE — ED Notes (Signed)
ED Provider at bedside with interpretor video device

## 2017-05-22 NOTE — ED Triage Notes (Signed)
The pt at work yesterday and turned his head to the right and began to have  Neck pain

## 2018-12-14 IMAGING — US US ABDOMEN COMPLETE
1 series · 14 of 25 positions shown · non-contrast
Comparison: None.

CLINICAL DATA: 30-year-old male with nausea 6 months. Initial
encounter.

EXAM:
ABDOMEN ULTRASOUND COMPLETE

[Series 1: us abdomen complete · 0.14mm/px · 14 of 83 slices shown]
[im 1/83]
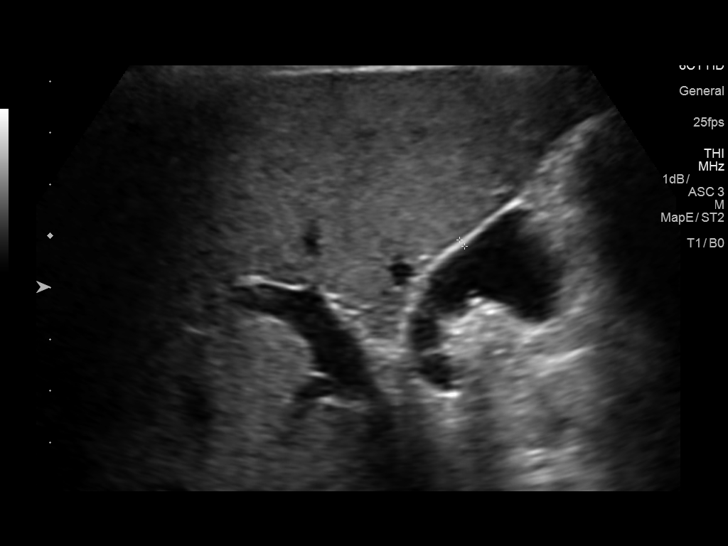
[im 7/83]
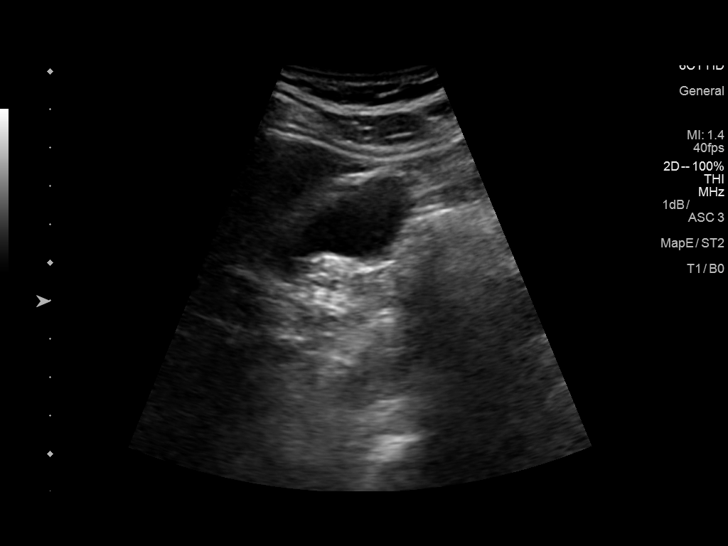
[im 14/83]
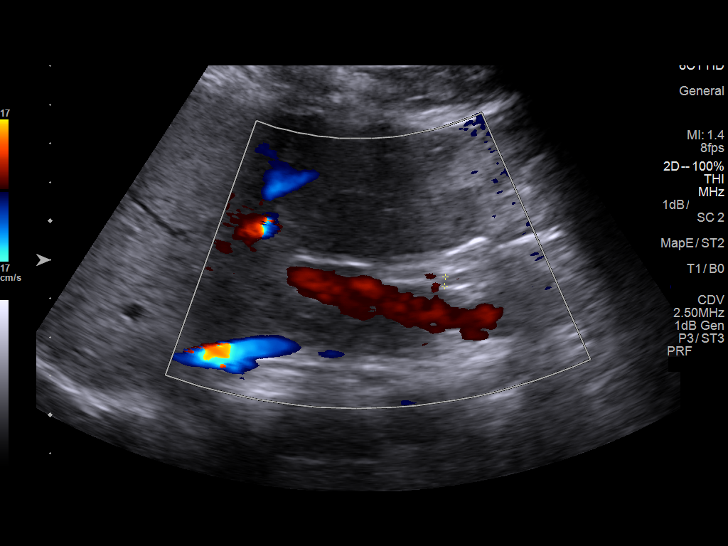
[im 21/83]
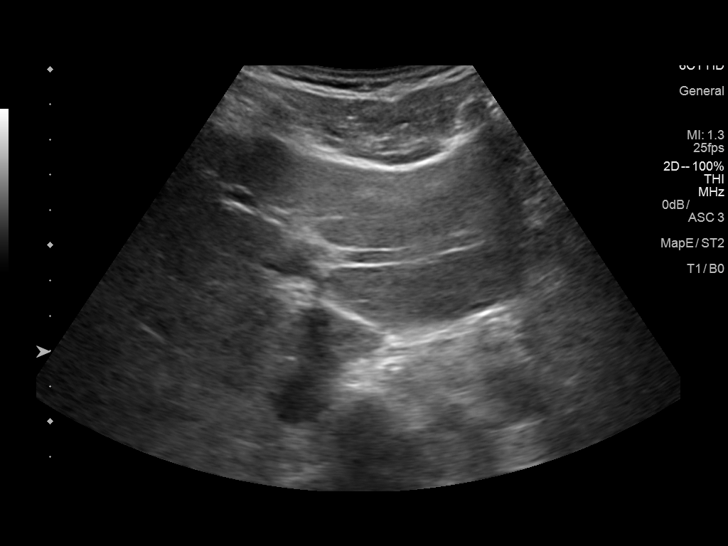
[im 28/83]
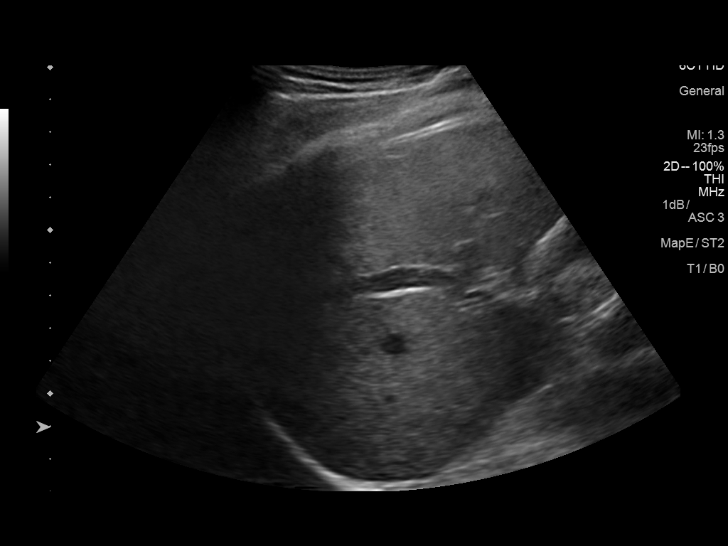
[im 31/83]
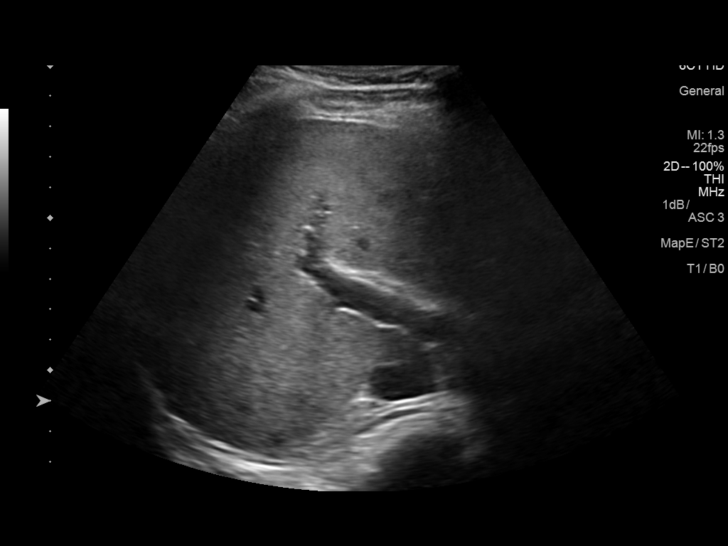
[im 38/83]
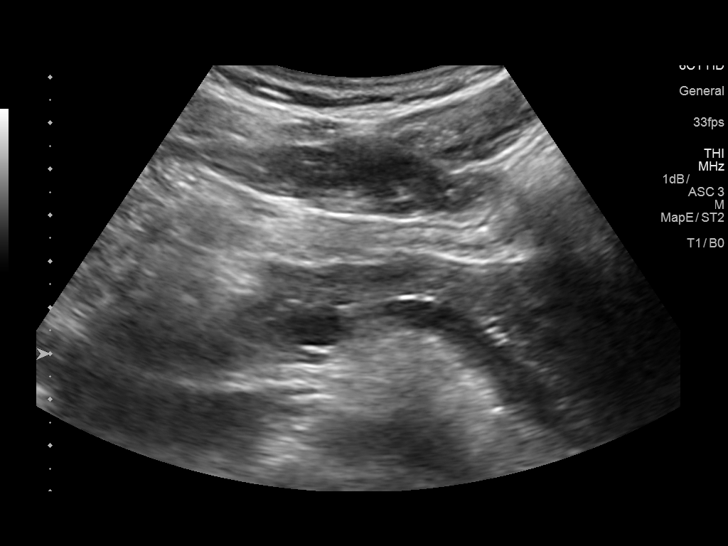
[im 45/83]
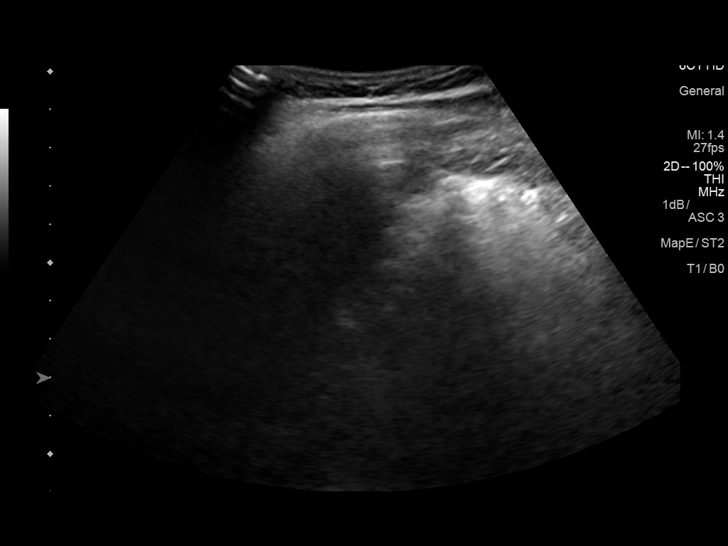
[im 52/83]
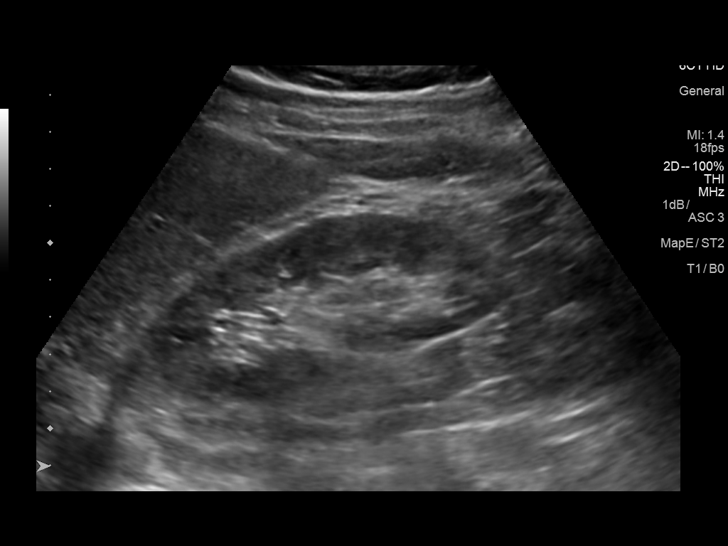
[im 55/83]
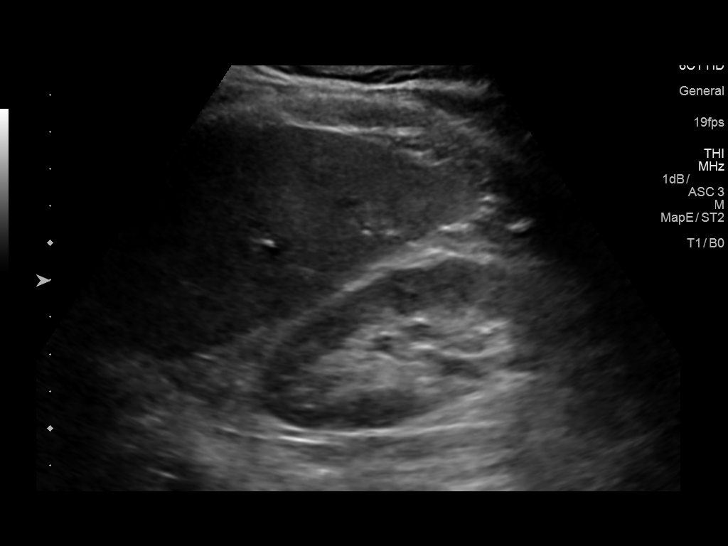
[im 62/83]
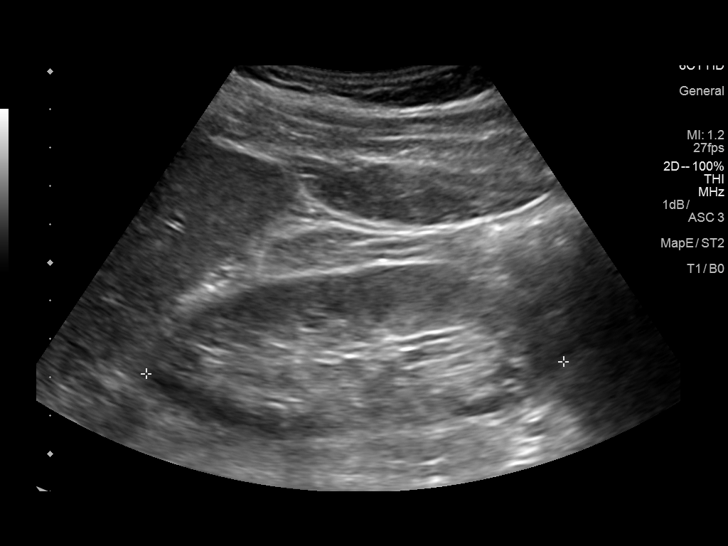
[im 69/83]
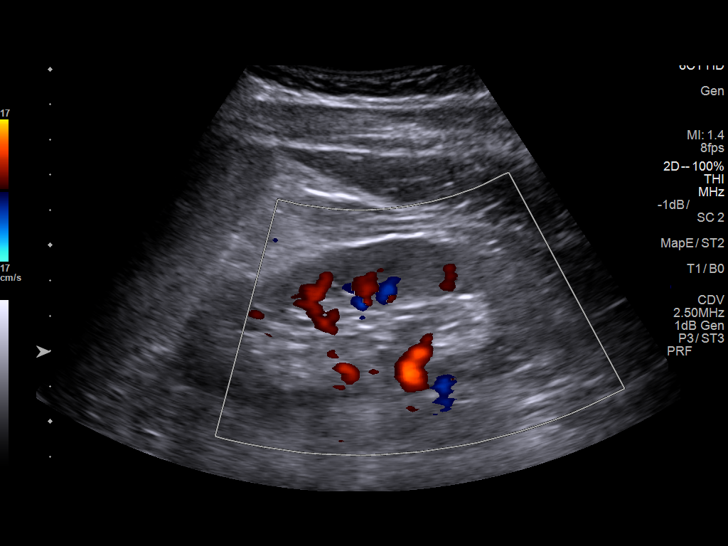
[im 76/83]
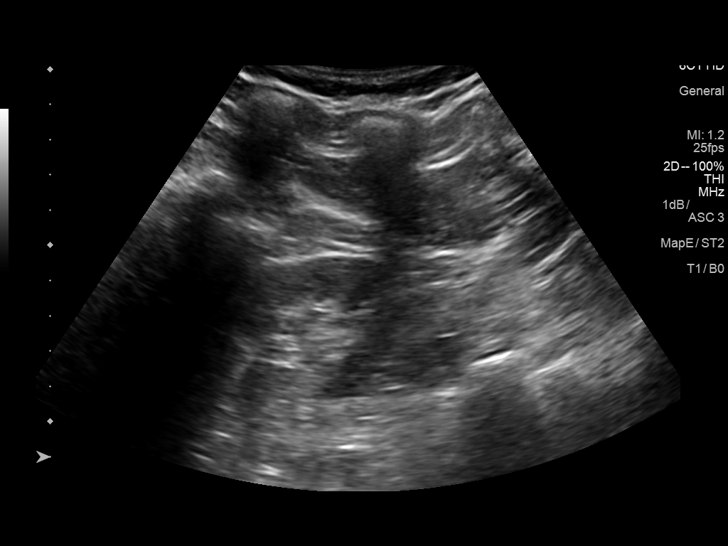
[im 83/83]
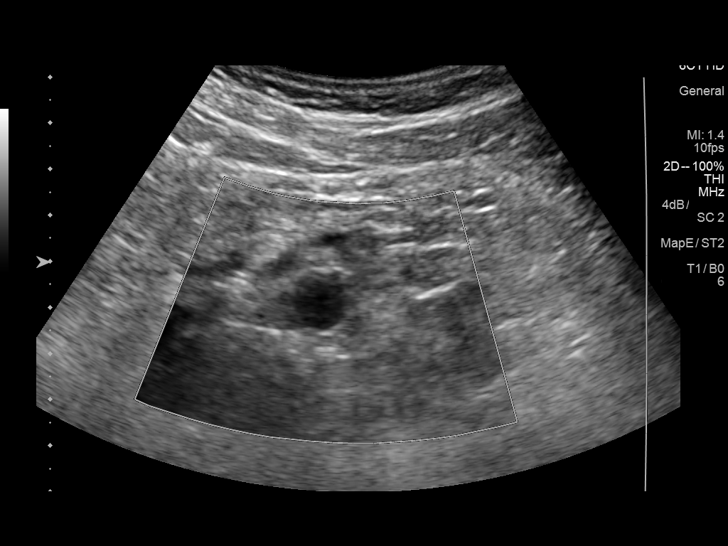

[14 of 25 positions shown; findings below may reference images not displayed]

FINDINGS: Gallbladder: No gallstones or wall thickening visualized. No
sonographic Murphy sign noted by sonographer.

Common bile duct: Diameter: 3 mm

Liver: Slight increased echogenicity without focal mass or
intrahepatic biliary duct dilatation. Hepatopetal flow within the
main portal vein.

IVC: No abnormality visualized.

Pancreas: Visualized portion unremarkable.

Spleen: Size and appearance within normal limits.

Right Kidney: Length: 10.2 cm. Echogenicity within normal limits. No
mass or hydronephrosis visualized.

Left Kidney: Length: 10.9 cm. Echogenicity within normal limits. No
mass or hydronephrosis visualized.

Abdominal aorta: No aneurysm visualized.

Other findings: None.
IMPRESSION: Slight increased echogenicity of the liver may represent mild fatty
infiltration. Remainder of exam unremarkable.

## 2022-01-10 ENCOUNTER — Encounter (HOSPITAL_COMMUNITY): Payer: Self-pay

## 2022-01-10 ENCOUNTER — Emergency Department (HOSPITAL_COMMUNITY)
Admission: EM | Admit: 2022-01-10 | Discharge: 2022-01-11 | Disposition: A | Discharge status: Home / Self Care | Payer: BC Managed Care – PPO | Attending: Emergency Medicine | Admitting: Emergency Medicine

## 2022-01-10 ENCOUNTER — Other Ambulatory Visit: Payer: Self-pay

## 2022-01-10 DIAGNOSIS — R509 Fever, unspecified: Secondary | ICD-10-CM | POA: Diagnosis present

## 2022-01-10 DIAGNOSIS — J020 Streptococcal pharyngitis: Secondary | ICD-10-CM | POA: Diagnosis not present

## 2022-01-10 NOTE — ED Triage Notes (Signed)
Pt complains of generalized weakness, fatigue, difficulty sleeping, sore throat, and chills. Pt was seen at [facility] today and had positive rapid strep test. He was discharged with augmentin however continues to feel bad prompting ED visit. Denies any difficulty swallowing, drooling, voice change.  ?

## 2022-01-10 NOTE — ED Provider Triage Note (Signed)
Emergency Medicine Provider Triage Evaluation Note ? ?Tremont Soderlund , a 36 y.o. male  was evaluated in triage.  Pt complains of generalized weakness, fatigue, difficulty sleeping, sore throat, and chills. Pt was seen at [facility] today and had positive rapid strep test. He was discharged with augmentin however continues to feel bad prompting ED visit. Denies any difficulty swallowing, drooling, voice change.  ? ?Review of Systems  ?Positive: + sore throat, weakness, fatigue, body aches ?Negative:  ? ?Physical Exam  ?BP 105/74 (BP Location: Right Arm)   Pulse 76   Temp 99.5 ?F (37.5 ?C) (Oral)   Resp 18   SpO2 97%  ?Gen:   Awake, no distress   ?Resp:  Normal effort  ?MSK:   Moves extremities without difficulty  ?Other:  Uvula midline. + posterior oropharynx edema and erythema.  ? ?Medical Decision Making  ?Medically screening exam initiated at 10:30 PM.  Appropriate orders placed.  Anthany Genao was informed that the remainder of the evaluation will be completed by another provider, this initial triage assessment does not replace that evaluation, and the importance of remaining in the ED until their evaluation is complete. ? ? ?  ?Tanda Rockers, PA-C ?01/10/22 2241 ? ?

## 2022-01-11 MED ORDER — KETOROLAC TROMETHAMINE 60 MG/2ML IM SOLN
30.0000 mg | Freq: Once | INTRAMUSCULAR | Status: AC
  Administered 2022-01-11: 30 mg via INTRAMUSCULAR
  Filled 2022-01-11: qty 2

## 2022-01-11 MED ORDER — DEXAMETHASONE SODIUM PHOSPHATE 10 MG/ML IJ SOLN
10.0000 mg | Freq: Once | INTRAMUSCULAR | Status: AC
  Administered 2022-01-11: 10 mg via INTRAMUSCULAR
  Filled 2022-01-11: qty 1

## 2022-01-11 MED ORDER — OXYCODONE-ACETAMINOPHEN 5-325 MG PO TABS
1.0000 | ORAL_TABLET | Freq: Once | ORAL | Status: AC
  Administered 2022-01-11: 1 via ORAL
  Filled 2022-01-11: qty 1

## 2022-01-11 NOTE — ED Provider Notes (Signed)
?[facility] EMERGENCY DEPARTMENT ?Provider Note ? ? ?CSN: [id number] ?Arrival date & time: 01/10/22  2118 ? ?  ? ?History ? ?Chief Complaint  ?Patient presents with  ? Generalized Body Aches  ? ? ?Drew Stanton is a 36 y.o. male. ? ?Patient presents to the emergency department for persistent fever, chills, sore throat, generalized fatigue.  Symptoms present for a couple of days.  Patient seen in urgent care today and diagnosed with strep throat.  He has taken 2 doses of Augmentin but symptoms continue.  No difficulty swallowing. ? ? ?  ? ?Home Medications ?Prior to Admission medications   ?Medication Sig Start Date End Date Taking? Authorizing Provider  ?cyclobenzaprine (FLEXERIL) 5 MG tablet Take 1 tablet (5 mg total) by mouth 3 (three) times daily as needed for muscle spasms. 05/22/17   Fayrene Helper, PA-C  ?ibuprofen (ADVIL,MOTRIN) 800 MG tablet Take 1 tablet (800 mg total) by mouth every 8 (eight) hours as needed for headache or moderate pain. 05/22/17   Fayrene Helper, PA-C  ?ipratropium (ATROVENT) 0.06 % nasal spray Place 2 sprays into the nose 4 (four) times daily. 06/17/15   Linna Hoff, MD  ?loperamide (IMODIUM) 2 MG capsule Take 1 capsule (2 mg total) by mouth 4 (four) times daily as needed for diarrhea or loose stools. 08/29/13   Sunnie Nielsen, MD  ?meclizine (ANTIVERT) 50 MG tablet Take 1 tablet (50 mg total) by mouth 3 (three) times daily as needed. For dizziness 06/17/15   Linna Hoff, MD  ?ranitidine (ZANTAC) 150 MG tablet Take 1 tablet (150 mg total) by mouth 2 (two) times daily. 03/24/16   Linna Hoff, MD  ?   ? ?Allergies    ?Patient has no known allergies.   ? ?Review of Systems   ?Review of Systems  ?Constitutional:  Positive for chills and fatigue.  ?HENT:  Positive for sore throat.   ?Musculoskeletal:  Positive for myalgias.  ? ?Physical Exam ?Updated Vital Signs ?BP (!) 131/97   Pulse 76   Temp 99.5 ?F (37.5 ?C) (Oral)   Resp 15   SpO2 100%  ?Physical Exam ?Vitals and nursing note  reviewed.  ?Constitutional:   ?   General: He is not in acute distress. ?   Appearance: He is well-developed.  ?HENT:  ?   Head: Normocephalic and atraumatic.  ?   Mouth/Throat:  ?   Mouth: Mucous membranes are moist.  ?   Pharynx: Posterior oropharyngeal erythema present. No oropharyngeal exudate.  ?   Tonsils: No tonsillar abscesses.  ?Eyes:  ?   General: Vision grossly intact. Gaze aligned appropriately.  ?   Extraocular Movements: Extraocular movements intact.  ?   Conjunctiva/sclera: Conjunctivae normal.  ?Cardiovascular:  ?   Rate and Rhythm: Normal rate and regular rhythm.  ?   Pulses: Normal pulses.  ?   Heart sounds: Normal heart sounds, S1 normal and S2 normal. No murmur heard. ?  No friction rub. No gallop.  ?Pulmonary:  ?   Effort: Pulmonary effort is normal. No respiratory distress.  ?   Breath sounds: Normal breath sounds.  ?Abdominal:  ?   Palpations: Abdomen is soft.  ?   Tenderness: There is no abdominal tenderness. There is no guarding or rebound.  ?   Hernia: No hernia is present.  ?Musculoskeletal:     ?   General: No swelling.  ?   Cervical back: Full passive range of motion without pain, normal range of motion and  neck supple. No pain with movement, spinous process tenderness or muscular tenderness. Normal range of motion.  ?   Right lower leg: No edema.  ?   Left lower leg: No edema.  ?Skin: ?   General: Skin is warm and dry.  ?   Capillary Refill: Capillary refill takes less than 2 seconds.  ?   Findings: No ecchymosis, erythema, lesion or wound.  ?Neurological:  ?   Mental Status: He is alert and oriented to person, place, and time.  ?   GCS: GCS eye subscore is 4. GCS verbal subscore is 5. GCS motor subscore is 6.  ?   Cranial Nerves: Cranial nerves 2-12 are intact.  ?   Sensory: Sensation is intact.  ?   Motor: Motor function is intact. No weakness or abnormal muscle tone.  ?   Coordination: Coordination is intact.  ?Psychiatric:     ?   Mood and Affect: Mood normal.     ?   Speech: Speech  normal.     ?   Behavior: Behavior normal.  ? ? ?ED Results / Procedures / Treatments   ?Labs ?(all labs ordered are listed, but only abnormal results are displayed) ?Labs Reviewed - No data to display ? ?EKG ?None ? ?Radiology ?No results found. ? ?Procedures ?Procedures  ? ? ?Medications Ordered in ED ?Medications  ?oxyCODONE-acetaminophen (PERCOCET/ROXICET) 5-325 MG per tablet 1 tablet (has no administration in time range)  ?ketorolac (TORADOL) injection 30 mg (has no administration in time range)  ?dexamethasone (DECADRON) injection 10 mg (has no administration in time range)  ? ? ?ED Course/ Medical Decision Making/ A&P ?  ?                        ?Medical Decision Making ? ?Presents to the emergency department for evaluation of sore throat, fever, chills, malaise, myalgias.  Records from urgent care reviewed.  Patient negative for COVID and influenza.  Strep was positive.  This explains his symptoms.  He was appropriately prescribed Augmentin, has taken 2 doses.  Oropharyngeal examination does not show any signs of peritonsillar abscess.  Will treat with analgesia and give a shot of Decadron to help facilitate improvement. ? ? ? ? ? ? ? ?Final Clinical Impression(s) / ED Diagnoses ?Final diagnoses:  ?Strep throat  ? ? ?Rx / DC Orders ?ED Discharge Orders   ? ? None  ? ?  ? ? ?  ?Orpah Greek, MD ?01/11/22 0221 ? ?

## 2022-01-11 NOTE — ED Notes (Signed)
E-signature pad unavailable at time of pt discharge. This RN discussed discharge materials with pt and answered all pt questions. Pt stated understanding of discharge material. ? ?
